# Patient Record
Sex: Male | Born: 1995 | Race: Black or African American | Hispanic: No | Marital: Single | Smoking: Current every day smoker
Health system: Southern US, Community
[De-identification: ages and names within clinical notes are randomized; demographics above are authoritative.]

---
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
## 2014-07-03 ENCOUNTER — Emergency Department (HOSPITAL_COMMUNITY)
Admission: EM | Admit: 2014-07-03 | Discharge: 2014-07-03 | Disposition: A | Discharge status: Home / Self Care | Payer: No Typology Code available for payment source | Attending: Emergency Medicine | Admitting: Emergency Medicine

## 2014-07-03 ENCOUNTER — Encounter (HOSPITAL_COMMUNITY): Payer: Self-pay | Admitting: Emergency Medicine

## 2014-07-03 DIAGNOSIS — Z79899 Other long term (current) drug therapy: Secondary | ICD-10-CM | POA: Diagnosis not present

## 2014-07-03 DIAGNOSIS — Z791 Long term (current) use of non-steroidal anti-inflammatories (NSAID): Secondary | ICD-10-CM | POA: Insufficient documentation

## 2014-07-03 DIAGNOSIS — F17200 Nicotine dependence, unspecified, uncomplicated: Secondary | ICD-10-CM | POA: Insufficient documentation

## 2014-07-03 DIAGNOSIS — R109 Unspecified abdominal pain: Secondary | ICD-10-CM | POA: Diagnosis present

## 2014-07-03 DIAGNOSIS — M545 Low back pain, unspecified: Secondary | ICD-10-CM | POA: Diagnosis not present

## 2014-07-03 MED ORDER — CYCLOBENZAPRINE HCL 10 MG PO TABS: 10.0000 mg | ORAL_TABLET | Freq: Three times a day (TID) | ORAL | Status: AC | PRN

## 2014-07-03 MED ORDER — IBUPROFEN 800 MG PO TABS: 800.0000 mg | ORAL_TABLET | Freq: Three times a day (TID) | ORAL | Status: AC

## 2014-07-03 NOTE — ED Notes (Signed)
Pt. States that he has no lifted anything heavy recently. Only one incident of nausea on Monday. No N/V/D since.

## 2014-07-03 NOTE — Discharge Instructions (Signed)
Back Pain, Adult Low back pain is very common. About 1 in 5 people have back pain.The cause of low back pain is rarely dangerous. The pain often gets better over time.About half of people with a sudden onset of back pain feel better in just 2 weeks. About 8 in 10 people feel better by 6 weeks.  CAUSES Some common causes of back pain include:  Strain of the muscles or ligaments supporting the spine.  Wear and tear (degeneration) of the spinal discs.  Arthritis.  Direct injury to the back. DIAGNOSIS Most of the time, the direct cause of low back pain is not known.However, back pain can be treated effectively even when the exact cause of the pain is unknown.Answering your caregiver's questions about your overall health and symptoms is one of the most accurate ways to make sure the cause of your pain is not dangerous. If your caregiver needs more information, he or she may order lab work or imaging tests (X-rays or MRIs).However, even if imaging tests show changes in your back, this usually does not require surgery. HOME CARE INSTRUCTIONS For many people, back pain returns.Since low back pain is rarely dangerous, it is often a condition that people can learn to manageon their own.   Remain active. It is stressful on the back to sit or stand in one place. Do not sit, drive, or stand in one place for more than 30 minutes at a time. Take short walks on level surfaces as soon as pain allows.Try to increase the length of time you walk each day.  Do not stay in bed.Resting more than 1 or 2 days can delay your recovery.  Do not avoid exercise or work.Your body is made to move.It is not dangerous to be active, even though your back may hurt.Your back will likely heal faster if you return to being active before your pain is gone.  Pay attention to your body when you bend and lift. Many people have less discomfortwhen lifting if they bend their knees, keep the load close to their bodies,and  avoid twisting. Often, the most comfortable positions are those that put less stress on your recovering back.  Find a comfortable position to sleep. Use a firm mattress and lie on your side with your knees slightly bent. If you lie on your back, put a pillow under your knees.  Only take over-the-counter or prescription medicines as directed by your caregiver. Over-the-counter medicines to reduce pain and inflammation are often the most helpful.Your caregiver may prescribe muscle relaxant drugs.These medicines help dull your pain so you can more quickly return to your normal activities and healthy exercise.  Put ice on the injured area.  Put ice in a plastic bag.  Place a towel between your skin and the bag.  Leave the ice on for 15-20 minutes, 03-04 times a day for the first 2 to 3 days. After that, ice and heat may be alternated to reduce pain and spasms.  Ask your caregiver about trying back exercises and gentle massage. This may be of some benefit.  Avoid feeling anxious or stressed.Stress increases muscle tension and can worsen back pain.It is important to recognize when you are anxious or stressed and learn ways to manage it.Exercise is a great option. SEEK MEDICAL CARE IF:  You have pain that is not relieved with rest or medicine.  You have pain that does not improve in 1 week.  You have new symptoms.  You are generally not feeling well. SEEK   IMMEDIATE MEDICAL CARE IF:   You have pain that radiates from your back into your legs.  You develop new bowel or bladder control problems.  You have unusual weakness or numbness in your arms or legs.  You develop nausea or vomiting.  You develop abdominal pain.  You feel faint. Document Released: 10/04/2005 Document Revised: 04/04/2012 Document Reviewed: 02/05/2014 [employer] Patient Information 2015 [employer]. This information is not intended to replace advice given to you by your health care provider. Make sure you  discuss any questions you have with your health care provider.  

## 2014-07-03 NOTE — ED Notes (Signed)
Pt c/o rt flank pain since Monday w/ dry heaves.  No vomiting or diarrhea.  Denies dysuria.

## 2014-07-03 NOTE — ED Provider Notes (Addendum)
CSN: [id number]     Arrival date & time 07/03/14  1327 History   First MD Initiated Contact with Patient 07/03/14 1413     Chief Complaint  Patient presents with  . Flank Pain     (Consider location/radiation/quality/duration/timing/severity/associated sxs/prior Treatment) HPI Comments: Patient presents to the ER for evaluation of back pain. Patient reports that he has been having pain in his lower back, more on the right, for 2 days. Patient denies any injury. The pain waxes and wanes. It hurts more when he bends or twists. Pain does not radiate to the legs. No urinary frequency, dysuria, hematuria. Patient also had some nausea 2 days ago, but that resolved.  Patient is a 18 y.o. male presenting with flank pain.  Flank Pain Pertinent negatives include no abdominal pain.    History reviewed. No pertinent past medical history. History reviewed. No pertinent past surgical history. History reviewed. No pertinent family history. History  Substance Use Topics  . Smoking status: Current Every Day Smoker    Types: Cigars  . Smokeless tobacco: Not on file  . Alcohol Use: No    Review of Systems  Gastrointestinal: Negative for abdominal pain.  Genitourinary: Positive for flank pain.  Musculoskeletal: Positive for back pain.  All other systems reviewed and are negative.     Allergies  Review of patient's allergies indicates no known allergies.  Home Medications   Prior to Admission medications   Medication Sig Start Date End Date Taking? Authorizing Provider  cyclobenzaprine (FLEXERIL) 10 MG tablet Take 1 tablet (10 mg total) by mouth 3 (three) times daily as needed for muscle spasms. 07/03/14   Gilda Crease, MD  ibuprofen (ADVIL,MOTRIN) 800 MG tablet Take 1 tablet (800 mg total) by mouth 3 (three) times daily. 07/03/14   Gilda Crease, MD   BP 112/59  Pulse 99  Temp(Src) 98.9 F (37.2 C) (Oral)  Resp 17  SpO2 98% Physical Exam  Constitutional: He is  oriented to person, place, and time. He appears well-developed and well-nourished. No distress.  HENT:  Head: Normocephalic and atraumatic.  Right Ear: Hearing normal.  Left Ear: Hearing normal.  Nose: Nose normal.  Mouth/Throat: Oropharynx is clear and moist and mucous membranes are normal.  Eyes: Conjunctivae and EOM are normal. Pupils are equal, round, and reactive to light.  Neck: Normal range of motion. Neck supple.  Cardiovascular: Regular rhythm, S1 normal and S2 normal.  Exam reveals no gallop and no friction rub.   No murmur heard. Pulmonary/Chest: Effort normal and breath sounds normal. No respiratory distress. He exhibits no tenderness.  Abdominal: Soft. Normal appearance and bowel sounds are normal. There is no hepatosplenomegaly. There is no tenderness. There is no rebound, no guarding, no tenderness at McBurney's point and negative Murphy's sign. No hernia.  Musculoskeletal: Normal range of motion.       Lumbar back: He exhibits tenderness. He exhibits no bony tenderness.       Back:  Neurological: He is alert and oriented to person, place, and time. He has normal strength. No cranial nerve deficit or sensory deficit. Coordination normal. GCS eye subscore is 4. GCS verbal subscore is 5. GCS motor subscore is 6.  Skin: Skin is warm, dry and intact. No rash noted. No cyanosis.  Psychiatric: He has a normal mood and affect. His speech is normal and behavior is normal. Thought content normal.    ED Course  Procedures (including critical care time) Labs Review Labs Reviewed - No data to  display  Imaging Review No results found.   EKG Interpretation None      MDM   Final diagnoses:  Right-sided low back pain without sciatica   Patient presents to the ER with musculoskeletal back pain. Examination reveals back tenderness without any associated neurologic findings. Patient's strength, sensation and reflexes were normal. As such, patient did not require any imaging or  further studies. Patient was treated with analgesia.  Patient does not have any abdominal tenderness and there was never any abdominal pain. He is not presenting any shortness of breath. No concern for pulmonary pathology.    Gilda Crease, MD 07/03/14 1450  Gilda Crease, MD 07/03/14 [phone]

## 2015-11-20 ENCOUNTER — Encounter (HOSPITAL_COMMUNITY): Payer: Self-pay | Admitting: *Deleted

## 2015-11-20 ENCOUNTER — Emergency Department (HOSPITAL_COMMUNITY)
Admission: EM | Admit: 2015-11-20 | Discharge: 2015-11-20 | Disposition: A | Discharge status: Home / Self Care | Payer: 59 | Attending: Emergency Medicine | Admitting: Emergency Medicine

## 2015-11-20 ENCOUNTER — Emergency Department (HOSPITAL_COMMUNITY)
Admission: EM | Admit: 2015-11-20 | Discharge: 2015-11-20 | Disposition: A | Discharge status: Home / Self Care | Payer: No Typology Code available for payment source | Attending: Emergency Medicine | Admitting: Emergency Medicine

## 2015-11-20 ENCOUNTER — Emergency Department (HOSPITAL_COMMUNITY): Payer: 59

## 2015-11-20 ENCOUNTER — Other Ambulatory Visit: Payer: Self-pay

## 2015-11-20 ENCOUNTER — Encounter (HOSPITAL_COMMUNITY): Payer: Self-pay

## 2015-11-20 DIAGNOSIS — F17210 Nicotine dependence, cigarettes, uncomplicated: Secondary | ICD-10-CM | POA: Diagnosis not present

## 2015-11-20 DIAGNOSIS — R079 Chest pain, unspecified: Secondary | ICD-10-CM | POA: Insufficient documentation

## 2015-11-20 DIAGNOSIS — R5383 Other fatigue: Secondary | ICD-10-CM | POA: Insufficient documentation

## 2015-11-20 LAB — URINALYSIS, ROUTINE W REFLEX MICROSCOPIC
Bilirubin Urine: NEGATIVE
GLUCOSE, UA: NEGATIVE mg/dL
Hgb urine dipstick: NEGATIVE
Ketones, ur: NEGATIVE mg/dL
LEUKOCYTES UA: NEGATIVE
NITRITE: NEGATIVE
PH: 7.5 (ref 5.0–8.0)
Protein, ur: NEGATIVE mg/dL
SPECIFIC GRAVITY, URINE: 1.019 (ref 1.005–1.030)

## 2015-11-20 LAB — CBC
HEMATOCRIT: 45.7 % (ref 39.0–52.0)
HEMOGLOBIN: 15.8 g/dL (ref 13.0–17.0)
MCH: 30.7 pg (ref 26.0–34.0)
MCHC: 34.6 g/dL (ref 30.0–36.0)
MCV: 88.7 fL (ref 78.0–100.0)
Platelets: 230 10*3/uL (ref 150–400)
RBC: 5.15 MIL/uL (ref 4.22–5.81)
RDW: 12.7 % (ref 11.5–15.5)
WBC: 6.3 10*3/uL (ref 4.0–10.5)

## 2015-11-20 LAB — BASIC METABOLIC PANEL
ANION GAP: 9 (ref 5–15)
BUN: 9 mg/dL (ref 6–20)
CHLORIDE: 106 mmol/L (ref 101–111)
CO2: 27 mmol/L (ref 22–32)
Calcium: 9.4 mg/dL (ref 8.9–10.3)
Creatinine, Ser: 1.09 mg/dL (ref 0.61–1.24)
GFR calc Af Amer: >60 mL/min (ref >60)
Glucose, Bld: 100 mg/dL — ABNORMAL HIGH (ref 65–99)
Potassium: 4.7 mmol/L (ref 3.5–5.1)
Sodium: 142 mmol/L (ref 135–145)

## 2015-11-20 NOTE — ED Notes (Signed)
Called twice with no response. 

## 2015-11-20 NOTE — ED Provider Notes (Signed)
CSN: [id number]     Arrival date & time 11/20/15  1758 History   First MD Initiated Contact with Patient 11/20/15 2012     Chief Complaint  Patient presents with  . Fatigue  . Chest Pain     (Consider location/radiation/quality/duration/timing/severity/associated sxs/prior Treatment) Patient is a 20 y.o. male presenting with chest pain. The history is provided by the patient. No language interpreter was used.  Chest Pain Pain location:  Substernal area Pain quality: aching   Pain radiates to:  Does not radiate Pain radiates to the back: no   Associated symptoms: fatigue   Associated symptoms: no abdominal pain, no cough, no fever, no headache, no nausea, no shortness of breath and no weakness   Associated symptoms comment:  Patient presents with complaint of cramping, tight chest pain that started 4 days ago. He describes the pain as occurring in the morning only and goes away shortly after he is up and active. He has noticed that he has felt fatigued and worn out for the rest of the day since symptoms began. No cough, fever, SOB, nausea. He is currently pain free. He was seen at an urgent care yesterday and says he was told to come to the ED for further evaluation of an abnormal EKG. He presented to the ED today.   History reviewed. No pertinent past medical history. History reviewed. No pertinent past surgical history. No family history on file. Social History  Substance Use Topics  . Smoking status: Current Every Day Smoker    Types: Cigars  . Smokeless tobacco: None  . Alcohol Use: No    Review of Systems  Constitutional: Positive for fatigue. Negative for fever and chills.  Respiratory: Negative.  Negative for cough and shortness of breath.   Cardiovascular: Positive for chest pain.  Gastrointestinal: Negative.  Negative for nausea and abdominal pain.  Musculoskeletal: Negative.  Negative for myalgias.  Neurological: Negative.  Negative for syncope, weakness and headaches.       Allergies  Review of patient's allergies indicates no known allergies.  Home Medications   Prior to Admission medications   Medication Sig Start Date End Date Taking? Authorizing Provider  cyclobenzaprine (FLEXERIL) 10 MG tablet Take 1 tablet (10 mg total) by mouth 3 (three) times daily as needed for muscle spasms. 07/03/14   Gilda Crease, MD  ibuprofen (ADVIL,MOTRIN) 800 MG tablet Take 1 tablet (800 mg total) by mouth 3 (three) times daily. 07/03/14   Gilda Crease, MD   BP 116/72 mmHg  Pulse 63  Temp(Src) 98.1 F (36.7 C) (Oral)  Resp 12  SpO2 100% Physical Exam  Constitutional: He is oriented to person, place, and time. He appears well-developed and well-nourished.  HENT:  Head: Normocephalic.  Neck: Normal range of motion. Neck supple.  Cardiovascular: Normal rate and regular rhythm.   Pulmonary/Chest: Effort normal and breath sounds normal. He has no wheezes. He has no rales. He exhibits no tenderness.  Abdominal: Soft. Bowel sounds are normal. There is no tenderness. There is no rebound and no guarding.  Musculoskeletal: Normal range of motion.  Neurological: He is alert and oriented to person, place, and time.  Skin: Skin is warm and dry. No rash noted.  Psychiatric: He has a normal mood and affect.    ED Course  Procedures (including critical care time) Labs Review Labs Reviewed - No data to display Results for orders placed or performed during the hospital encounter of 11/20/15  Basic metabolic panel  Result Value  Ref Range   Sodium 142 135 - 145 mmol/L   Potassium 4.7 3.5 - 5.1 mmol/L   Chloride 106 101 - 111 mmol/L   CO2 27 22 - 32 mmol/L   Glucose, Bld 100 (H) 65 - 99 mg/dL   BUN 9 6 - 20 mg/dL   Creatinine, Ser 1.09 0.61 - 1.24 mg/dL   Calcium1.619.4 8.9 - 09.6 mg/dL   GFR calc non Af Amer >60 >60 mL/min   GFR calc Af Amer >60 >60 mL/min   Anion gap 9 5 - 15  CBC  Result Value Ref Range   WBC 6.3 4.0 - 10.5 K/uL   RBC 5.15 4.22 -  5.81 MIL/uL   Hemoglobin 15.8 13.0 - 17.0 g/dL   HCT 04.5 40.9 - 81.1 %   MCV 88.7 78.0 - 100.0 fL   MCH 30.7 26.0 - 34.0 pg   MCHC 34.6 30.0 - 36.0 g/dL   RDW 91.4 78.2 - 95.6 %   Platelets 230 150 - 400 K/uL  Urinalysis, Routine w reflex microscopic (not at [facility])  Result Value Ref Range   Color, Urine YELLOW YELLOW   APPearance CLEAR CLEAR   Specific Gravity, Urine 1.019 1.005 - 1.030   pH 7.5 5.0 - 8.0   Glucose, UA NEGATIVE NEGATIVE mg/dL   Hgb urine dipstick NEGATIVE NEGATIVE   Bilirubin Urine NEGATIVE NEGATIVE   Ketones, ur NEGATIVE NEGATIVE mg/dL   Protein, ur NEGATIVE NEGATIVE mg/dL   Nitrite NEGATIVE NEGATIVE   Leukocytes, UA NEGATIVE NEGATIVE    Imaging Review Dg Chest 2 View  11/20/2015  CLINICAL DATA:  Chest pain and shortness of breath for 3 days EXAM: CHEST  2 VIEW COMPARISON:  None. FINDINGS: The heart size and mediastinal contours are within normal limits. There is no focal infiltrate, pulmonary edema, or pleural effusion. The visualized skeletal structures are unremarkable. IMPRESSION: No active cardiopulmonary disease. Electronically Signed   By: Sherian Rein M.D.   On: 11/20/2015 21:34   I have personally reviewed and evaluated these images and lab results as part of my medical decision-making.   EKG Interpretation   Date/Time:  Thursday November 20 2015 20:17:43 EST Ventricular Rate:  64 PR Interval:  154 QRS Duration: 88 QT Interval:  378 QTC Calculation: 390 R Axis:   82 Text Interpretation:  Sinus rhythm LVH by voltage No significant change  since last tracing Confirmed by Bebe Shaggy  MD, DONALD ([id number]) on 11/20/2015  8:24:40 PM      MDM   Final diagnoses:  None    1. Chest discomfort  Pain x 5 days, occurring in morning only, is atypical for cardiac chest pain. EKG is nonacute without ischemia. Labs are unremarkable. The patient is very well appearing and comfortable. Currently eating and drinking in the room without difficulty. He is felt  stable for discharge home. PCP resources provided.     Elpidio Anis, PA-C 11/20/15 2159  Zadie Rhine, MD 11/21/15 [phone]

## 2015-11-20 NOTE — ED Notes (Signed)
Patient seen at [facility] yesterday for CP and today complaining of fatigue. Denies any other associated symptoms

## 2015-11-20 NOTE — ED Notes (Signed)
Pt was seen at urgent care today and then came to the ED for fatique and chest pain since Monday. States that he was already here, had an [facility]. Also says his blood was drawn. States he did not wait to be seen but came back to see what was wrong with him.

## 2015-11-20 NOTE — Discharge Instructions (Signed)
Nonspecific Chest Pain  °Chest pain can be caused by many different conditions. There is always a chance that your pain could be related to something serious, such as a heart attack or a blood clot in your lungs. Chest pain can also be caused by conditions that are not life-threatening. If you have chest pain, it is very important to follow up with your health care provider. °CAUSES  °Chest pain can be caused by: °· Heartburn. °· Pneumonia or bronchitis. °· Anxiety or stress. °· Inflammation around your heart (pericarditis) or lung (pleuritis or pleurisy). °· A blood clot in your lung. °· A collapsed lung (pneumothorax). It can develop suddenly on its own (spontaneous pneumothorax) or from trauma to the chest. °· Shingles infection (varicella-zoster virus). °· Heart attack. °· Damage to the bones, muscles, and cartilage that make up your chest wall. This can include: °¨ Bruised bones due to injury. °¨ Strained muscles or cartilage due to frequent or repeated coughing or overwork. °¨ Fracture to one or more ribs. °¨ Sore cartilage due to inflammation (costochondritis). °RISK FACTORS  °Risk factors for chest pain may include: °· Activities that increase your risk for trauma or injury to your chest. °· Respiratory infections or conditions that cause frequent coughing. °· Medical conditions or overeating that can cause heartburn. °· Heart disease or family history of heart disease. °· Conditions or health behaviors that increase your risk of developing a blood clot. °· Having had chicken pox (varicella zoster). °SIGNS AND SYMPTOMS °Chest pain can feel like: °· Burning or tingling on the surface of your chest or deep in your chest. °· Crushing, pressure, aching, or squeezing pain. °· Dull or sharp pain that is worse when you move, cough, or take a deep breath. °· Pain that is also felt in your back, neck, shoulder, or arm, or pain that spreads to any of these areas. °Your chest pain may come and go, or it may stay  constant. °DIAGNOSIS °Lab tests or other studies may be needed to find the cause of your pain. Your health care provider may have you take a test called an ambulatory ECG (electrocardiogram). An ECG records your heartbeat patterns at the time the test is performed. You may also have other tests, such as: °· Transthoracic echocardiogram (TTE). During echocardiography, sound waves are used to create a picture of all of the heart structures and to look at how blood flows through your heart. °· Transesophageal echocardiogram (TEE). This is a more advanced imaging test that obtains images from inside your body. It allows your health care provider to see your heart in finer detail. °· Cardiac monitoring. This allows your health care provider to monitor your heart rate and rhythm in real time. °· Holter monitor. This is a portable device that records your heartbeat and can help to diagnose abnormal heartbeats. It allows your health care provider to track your heart activity for several days, if needed. °· Stress tests. These can be done through exercise or by taking medicine that makes your heart beat more quickly. °· Blood tests. °· Imaging tests. °TREATMENT  °Your treatment depends on what is causing your chest pain. Treatment may include: °· Medicines. These may include: °¨ Acid blockers for heartburn. °¨ Anti-inflammatory medicine. °¨ Pain medicine for inflammatory conditions. °¨ Antibiotic medicine, if an infection is present. °¨ Medicines to dissolve blood clots. °¨ Medicines to treat coronary artery disease. °· Supportive care for conditions that do not require medicines. This may include: °¨ Resting. °¨ Applying heat   or cold packs to injured areas. °¨ Limiting activities until pain decreases. °HOME CARE INSTRUCTIONS °· If you were prescribed an antibiotic medicine, finish it all even if you start to feel better. °· Avoid any activities that bring on chest pain. °· Do not use any tobacco products, including  cigarettes, chewing tobacco, or electronic cigarettes. If you need help quitting, ask your health care provider. °· Do not drink alcohol. °· Take medicines only as directed by your health care provider. °· Keep all follow-up visits as directed by your health care provider. This is important. This includes any further testing if your chest pain does not go away. °· If heartburn is the cause for your chest pain, you may be told to keep your head raised (elevated) while sleeping. This reduces the chance that acid will go from your stomach into your esophagus. °· Make lifestyle changes as directed by your health care provider. These may include: °¨ Getting regular exercise. Ask your health care provider to suggest some activities that are safe for you. °¨ Eating a heart-healthy diet. A registered dietitian can help you to learn healthy eating options. °¨ Maintaining a healthy weight. °¨ Managing diabetes, if necessary. °¨ Reducing stress. °SEEK MEDICAL CARE IF: °· Your chest pain does not go away after treatment. °· You have a rash with blisters on your chest. °· You have a fever. °SEEK IMMEDIATE MEDICAL CARE IF:  °· Your chest pain is worse. °· You have an increasing cough, or you cough up blood. °· You have severe abdominal pain. °· You have severe weakness. °· You faint. °· You have chills. °· You have sudden, unexplained chest discomfort. °· You have sudden, unexplained discomfort in your arms, back, neck, or jaw. °· You have shortness of breath at any time. °· You suddenly start to sweat, or your skin gets clammy. °· You feel nauseous or you vomit. °· You suddenly feel light-headed or dizzy. °· Your heart begins to beat quickly, or it feels like it is skipping beats. °These symptoms may represent a serious problem that is an emergency. Do not wait to see if the symptoms will go away. Get medical help right away. Call your local emergency services (911 in the [place] Do not drive yourself to the hospital. °  °This  information is not intended to replace advice given to you by your health care provider. Make sure you discuss any questions you have with your health care provider. °  °Document Released: 07/14/2005 Document Revised: 10/25/2014 Document Reviewed: 05/10/2014 °Elsevier Interactive Patient Education ©2016 [employer]vier Inc[employer] ° ° °Emergency Department Resource Guide °1) Find a Doctor and Pay Out of Pocket °Although you won't have to find out who is covered by your insurance plan, it is a good idea to ask around and get recommendations. You will then need to call the office and see if the doctor you have chosen will accept you as a new patient and what types of options they offer for patients who are self-pay. Some doctors offer discounts or will set up payment plans for their patients who do not have insurance, but you will need to ask so you aren't surprised when you get to your appointment. ° °2) Contact Your Local Health Department °Not all health departments have doctors that can see patients for sick visits, but many do, so it is worth a call to see if yours does. If you don't know where your local health department is, you can check in your phone book.   The CDC also has a tool to help you locate your state's health department, and many state websites also have listings of all of their local health departments. ° °3) Find a Walk-in Clinic °If your illness is not likely to be very severe or complicated, you may want to try a walk in clinic. These are popping up all over the country in pharmacies, drugstores, and shopping centers. They're usually staffed by nurse practitioners or physician assistants that have been trained to treat common illnesses and complaints. They're usually fairly quick and inexpensive. However, if you have serious medical issues or chronic medical problems, these are probably not your best option. ° °No Primary Care Doctor: °- Call [facility] at  832-8000 - they can help you locate a primary  care doctor that  accepts your insurance, provides certain services, etc. °- Physician Referral Service- 1-800-533-3463 ° °Chronic Pain Problems: °Organization         Address  Phone   Notes  °[facility]  (336) 297-2271 Patients need to be referred by their primary care doctor.  ° °Medication Assistance: °Organization         Address  Phone   Notes  °[place] [facility] [address] °[place], [place] [address] (336) 641-8030 --Must be a resident of [place] °-- Must have NO insurance coverage whatsoever (no Medicaid/ Medicare, etc.) °-- The pt. MUST have a primary care doctor that directs their care regularly and follows them in the community °  °[facility]  (866) 331-1348   °[employer]  (888) 892-1162   ° °Agencies that provide inexpensive medical care: °Organization         Address  Phone   Notes  °[facility]  (336) 832-8035   °[facility]    (336) 832-7272   °[facility] [address] °[place], [place] [address] (336) 832-4777   °[facility] [address], °[place] (336) 271-4999   °[facility]    (336) 373-0678   °[facility]    (336) 272-1050   °[facility] ° [address], [place] Phone:  (336) 832-4444, Fax:  (336) 832-4440 Hours of Operation:  9 am - 6 pm, M-F.  Also accepts Medicaid/Medicare and self-pay.  °[facility] ° [address], [place] Phone: (336) [phone], Fax: (336) [phone]. Hours of Operation:  8:30 am - 5:30 pm, M-F.  Also accepts Medicaid and self-pay.  °[facility] [place] [address], [place] Phone: (336) 878-6027   °[facility] [address], [place], [place] (336)723-1848, Ext. 123 Mondays & Thursdays: 7-9 AM.  First 15 patients are seen on a first come, first serve basis. °  ° °Medicaid-accepting [place]  Providers: ° °Organization         Address  Phone   Notes  °[facility] [address], [place] (336) 641-2100 Also accepts self-pay patients.  °[facility] [address], [place] ° (336) 856-9996   °[facility] [address], [place] (336) 288-8857   °[facility] [address] [place] [address], [place] (336) 299-7000   °Veita Bland [address], [place]  ° (336) [phone] Only accepts [place] Access Medicaid patients after they have their name applied to their card.  ° °Self-Pay (  no insurance) in [place]: ° °Organization         Address  Phone   Notes  °Sickle Cell Patients, [facility] [address], [place] (336) 832-1970   °[facility] [address], [place] (336) 832-4400   °[facility] ° [address], [place] (336) 992-4800   °[facility]/Dr. Osei-Bonsu ° [address] [place] [address], [place] or [address], [place] (336) [phone] Phone number for both [place] and [place] locations is the same.  °[facility] [address], [place] (336) 299-0000   °[facility] [address] [place] [address], [place] or [address] (336) 852-7530 °(336) 878-2260   °[facility] [address], [place] (336) 350-1642, phone; (336) 294-5005, fax Sees patients 1st and 3rd Saturday of every month.  Must not qualify for public or private insurance (i.e. Medicaid, Medicare, [place] Health Choice, Veterans' Benefits) • Household income should be no more than 200% of the poverty level •The clinic cannot treat you if you are pregnant or think you are pregnant • Sexually transmitted diseases are not treated at the clinic.  ° ° °Dental Care: °Organization         Address  Phone  Notes  °[facility] [address], [place] (336) 641-6152 Accepts children up to age 21 who are enrolled in Medicaid or [place] Health Choice; pregnant women with a Medicaid card; and children who have applied for Medicaid or [place] Health Choice, but were declined, whose parents can pay a reduced fee at time of service.  °[facility] [place]  [address], [place] (336) 641-7733 Accepts children up to age 21 who are enrolled in Medicaid or [place] Health Choice; pregnant women with a Medicaid card; and children who have applied for Medicaid or [place] Health Choice, but were declined, whose parents can pay a reduced fee at time of service.  °[facility] ° [address], [place] (336) 641-4533 Patients are seen by appointment only. Walk-ins are not accepted. [facility] will see patients 18 years of age and older. °Monday - Tuesday (8am-5pm) °Most Wednesdays (8:30-5pm) °$30 per visit, cash only  °[facility] ° [address], [place] (336) [phone] Patients are seen by appointment only. Walk-ins are not accepted. [facility] will see patients 18 years of age and older. °One Wednesday Evening (Monthly: Volunteer Based).  $30 per visit, cash only  °[facility]  (919) [phone] for adults; Children under age 4, call Graduate Pediatric Dentistry at ([phone]) [phone]. Children aged 4-14, please call ([phone]) [phone] to request a pediatric application. ° Dental services are provided in all areas of dental care including fillings, crowns and bridges, complete and partial dentures, implants, gum treatment, root canals, and extractions. Preventive care is also provided. Treatment is provided to both adults and children. °Patients are selected via a lottery and there is often a waiting list. °  °[facility] [address], °[place] ° (336) 763-8833 [email or web address] °  °[facility]  [address], [place], [place] (336)723-1848, Ext. 123 Second and Fourth Thursday of each month, opens at 6:30 AM; Clinic ends at 9 AM.  Patients are seen on a first-come first-served basis, and a limited number are   seen during each clinic.  ° °[facility] ° [address], [place], [place] (336) 723-7904   Eligibility Requirements °You must have lived in [place], [place], or [place] counties for at least the last three months. °  You cannot be eligible for state or federal sponsored healthcare insurance, including Veterans Administration, Medicaid, or Medicare. °  You generally cannot be eligible for healthcare insurance through your employer.  °  How to apply: °Eligibility screenings are held every Tuesday and Wednesday afternoon from 1:00 pm until 4:00 pm. You do not need an appointment for the interview!  °[facility] [address], [place], [place] 336-631-2330   °[facility]  336-342-8273   °[facility]  336-703-3100   °[facility]  336-570-6415   ° °Behavioral Health Resources in the Community: °Intensive Outpatient Programs °Organization         Address  Phone  Notes  °[facility] [address], [place], [place] 336-878-6098   °[facility] [address], [place], [place] 336-832-9800   °[facility] [address], [place], [place] ° 336-882-2125   °[facility] [address],  °[place], [place] 1-800-853-5163 or 336-641-4981   °Substance Abuse Resources °Organization         Address  Phone  Notes  °[facility]  336-882-2125   °[facility]  336-784-9470   °[facility]  336-285-9073   °[facility]  336-845-3988   °Residential & Outpatient Substance Abuse Program  1-800-659-3381   °Psychological Services °Organization         Address  Phone  Notes  °[facility]  336- 832-9600    °[facility]  336- 378-7881   °[facility] [address], [place] 1-800-853-5163 or 336-641-4981   ° °Mobile Crisis Teams °Organization         Address  Phone  Notes  °[facility], Mobile Crisis Care Unit  1-877-626-1772   °Assertive °[facility] ° [address] [place], [place] 336-834-9664   °Sharon DeEsch [address] °[place] 336-554-5454   ° °Self-Help/Support Groups °Organization         Address  Phone             Notes  °[facility] [place] - variety of support groups  336- 373-1402 Call for more information  °Narcotics Anonymous (NA), [facility] [address], °[place]  2 meetings at this location  ° °Residential Treatment Programs °Organization         Address  Phone  Notes  °[facility] [address],    °[place]  1-866-801-8205   °[facility] ° [address], [place], [place] 704-293-8524   °[facility] [address], [place] 336-845-3988 Admissions: 8am-3pm M-F  °[facility] [address]    °[place], [place] 336-841-1104   °[facility] [address], , [place] 336-379-7146   °[facility] [address]  °, [place] 336-285-9073   °[facility] - Intensive Outpatient [address], , [place] 336-852-3033   °[facility] [address]  °[place], [place] 1-877-615-2722 or 336-784-9470   °[facility] [address] [place],  336-227-7417 Accepts Medicaid  °[facility] [address]  °  [place] 1-800-659-3381 Substance Abuse/Addiction Treatment  ° °[place] Behavioral Health Resources °Organization         Address  Phone  Notes  °[facility]  (888) 581-9988   °Julie Brannon, PhD [address] [place], [place]   (336) 349-5553 or (336) 951-0000    °[facility]   [address] °[place], [place] (336) 349-4454   °[facility] [address], [place], [place] (336) 342-8316 Insurance/Medicaid/sponsorship through [facility]  °[facility] [address]                                    [place], [place] (336) 342-8316 Therapy/tele-psych/case  °[facility] [address]  ° [place], [place] (336) 349-2233    °Dr. Arfeen  (336) 349-4544   °[facility] [place]  [employer] [place] [facility] 1) [address], [place] °2) [address], [place] °3)  [address], [place] (336) 349-3220 °(336) 342-7768 ° °(336) 342-8140   °[place] [facility] (336) 342-1394 or (336) 342-3537 (After Hours)    ° ° ° °
